# Patient Record
Sex: Male | Born: 1990 | Race: White | Hispanic: Yes | Marital: Married | Smoking: Never smoker
Health system: Southern US, Community
[De-identification: ages and names within clinical notes are randomized; demographics above are authoritative.]

---

## 2017-03-13 ENCOUNTER — Emergency Department (HOSPITAL_COMMUNITY): Payer: Worker's Compensation

## 2017-03-13 ENCOUNTER — Emergency Department (HOSPITAL_COMMUNITY)
Admission: EM | Admit: 2017-03-13 | Discharge: 2017-03-13 | Disposition: A | Discharge status: Home / Self Care | Payer: Worker's Compensation | Attending: Emergency Medicine | Admitting: Emergency Medicine

## 2017-03-13 ENCOUNTER — Encounter (HOSPITAL_COMMUNITY): Payer: Self-pay | Admitting: Emergency Medicine

## 2017-03-13 DIAGNOSIS — S61213A Laceration without foreign body of left middle finger without damage to nail, initial encounter: Secondary | ICD-10-CM

## 2017-03-13 DIAGNOSIS — Y9273 Farm field as the place of occurrence of the external cause: Secondary | ICD-10-CM | POA: Diagnosis not present

## 2017-03-13 DIAGNOSIS — Y990 Civilian activity done for income or pay: Secondary | ICD-10-CM | POA: Insufficient documentation

## 2017-03-13 DIAGNOSIS — W230XXA Caught, crushed, jammed, or pinched between moving objects, initial encounter: Secondary | ICD-10-CM | POA: Diagnosis not present

## 2017-03-13 DIAGNOSIS — Y9389 Activity, other specified: Secondary | ICD-10-CM | POA: Insufficient documentation

## 2017-03-13 MED ORDER — BUPIVACAINE HCL (PF) 0.5 % IJ SOLN: 10.0000 mL | Freq: Once | INTRAMUSCULAR | Status: DC

## 2017-03-13 MED ORDER — LIDOCAINE HCL (PF) 2 % IJ SOLN
10.0000 mL | Freq: Once | INTRAMUSCULAR | Status: AC
  Administered 2017-03-13: 10 mL
  Filled 2017-03-13: qty 10

## 2017-03-13 MED ORDER — TETANUS-DIPHTH-ACELL PERTUSSIS 5-2.5-18.5 LF-MCG/0.5 IM SUSP
0.5000 mL | Freq: Once | INTRAMUSCULAR | Status: AC
  Administered 2017-03-13: 0.5 mL via INTRAMUSCULAR
  Filled 2017-03-13: qty 0.5

## 2017-03-13 MED ORDER — CEPHALEXIN 500 MG PO CAPS: 500.0000 mg | ORAL_CAPSULE | Freq: Four times a day (QID) | ORAL | 0 refills | Status: AC

## 2017-03-13 MED ORDER — POVIDONE-IODINE 10 % EX SOLN
CUTANEOUS | Status: AC
  Filled 2017-03-13: qty 118

## 2017-03-13 MED ORDER — TRAMADOL HCL 50 MG PO TABS: 50.0000 mg | ORAL_TABLET | Freq: Four times a day (QID) | ORAL | 0 refills | Status: AC | PRN

## 2017-03-13 NOTE — ED Provider Notes (Signed)
[facility]-EM[facility]ENCY DEPT Provider Note   CSN: [id number] Arrival date & time: 03/13/17  0740     History   Chief Complaint Chief Complaint  Patient presents with  . Laceration    HPI Keith Mcfarland is a 26 y.o. 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  He denies numbness distal to the injury site. He has applied pressure to the site with hemostasis obtained.  He is unsure of his tetanus status.   The history is provided by the patient.    History reviewed. No pertinent past medical history.  There are no active problems to display for this patient.   History reviewed. No pertinent surgical history.     Home Medications    Prior to Admission medications   Medication Sig Start Date End Date Taking? Authorizing Provider  cephALEXin (KEFLEX) 500 MG capsule Take 1 capsule (500 mg total) by mouth 4 (four) times daily. 03/13/17   Burgess Amor, PA-C  traMADol (ULTRAM) 50 MG tablet Take 1 tablet (50 mg total) by mouth every 6 (six) hours as needed for moderate pain. 03/13/17   Burgess Amor, PA-C    Family History History reviewed. No pertinent family history.  Social History Social History  Substance Use Topics  . Smoking status: Never Smoker  . Smokeless tobacco: Never Used  . Alcohol use No     Allergies   Patient has no known allergies.   Review of Systems Review of Systems  Constitutional: Negative for fever.  Musculoskeletal: Positive for arthralgias. Negative for joint swelling and myalgias.  Skin: Positive for wound.  Neurological: Negative for weakness and numbness.     Physical Exam Updated Vital Signs BP 118/84 (BP Location: Right Arm)   Pulse (!) 52   Temp 99.2 F (37.3 C) (Oral)   Resp 17   SpO2 100%   Physical Exam  Constitutional: He is oriented to person, place, and time. He appears well-developed and well-nourished.    HENT:  Head: Normocephalic.  Cardiovascular: Normal rate.   Pulmonary/Chest: Effort normal.  Musculoskeletal: He exhibits tenderness.       Left hand: He exhibits bony tenderness and laceration. He exhibits no deformity. Normal sensation noted. He exhibits no thumb/finger opposition and no wrist extension trouble.  Linear abrasions noted volar and dorsal ring finger, hemostatic.  1 cm avulsion into the subcutaneous pip volar space of the long finger.  No deeper structures visualized. Distal sensation intact.   Neurological: He is alert and oriented to person, place, and time. No sensory deficit.  Skin: Laceration noted.     ED Treatments / Results  Labs (all labs ordered are listed, but only abnormal results are displayed) Labs Reviewed - No data to display  EKG  EKG Interpretation None       Radiology Dg Finger Middle Left  Result Date: 03/13/2017 CLINICAL DATA:  Laceration left middle digit. EXAM: LEFT MIDDLE FINGER 2+V COMPARISON:  No prior . FINDINGS: No acute bony or joint abnormality identified. No evidence of fracture or dislocation. Soft tissue laceration noted of the midportion of the left third digit. No radiopaque foreign body. IMPRESSION: No acute bony or joint abnormality identified. Soft tissue laceration noted over the midportion of the left third digit. No radiopaque foreign body . Electronically Signed   By: Maisie Fus  Register   On: 03/13/2017 08:19    Procedures Procedures (including critical care time)  LACERATION REPAIR Performed by: Burgess AmorIDOL, Ruger Saxer Authorized by: Burgess AmorIDOL, Litisha Guagliardo Consent: Verbal consent obtained. Risks and benefits: risks, benefits and alternatives were discussed Consent given by: patient Patient identity confirmed: provided demographic data Prepped and Draped in normal sterile fashion Wound explored  Laceration Location: left long finger  Laceration Length: 1cm  No Foreign Bodies seen or palpated  Anesthesia: digital block  Local  anesthetic: lidocaine 2% without epinephrine  Anesthetic total: 1 ml  Irrigation method: syringe Amount of cleaning: standard  Skin closure: ethilon 4-0  Number of sutures: #2 sutures applied to the ends of the wound for closer aproximation,  Central wound avulsed and not amenable to close approximation.  Xeroform applied, expect site will heal by secondary intent well.   Technique: simple interupted  Patient tolerance: Patient tolerated the procedure well with no immediate complications.   Medications Ordered in ED Medications  povidone-iodine (BETADINE) 10 % external solution (not administered)  lidocaine (XYLOCAINE) 2 % injection 10 mL (10 mLs Other Given by Other 03/13/17 0810)  Tdap (BOOSTRIX) injection 0.5 mL (0.5 mLs Intramuscular Given 03/13/17 0813)     Initial Impression / Assessment and Plan / ED Course  I have reviewed the triage vital signs and the nursing notes.  Pertinent labs & imaging results that were available during my care of the patient were reviewed by me and considered in my medical decision making (see chart for details).     Imaging reviewed, no fracture, pt displays FROM of digits, although painful, no deeper structures visualized, doubt ligament injury.  Dressings, xeroform, wound instructions, xeroform given.  He was updated with tetanus. Suture removal in 10 days.  Referral to Dr Romeo AppleHarrison for f/u care and/or return here for any worsened sx.    Final Clinical Impressions(s) / ED Diagnoses   Final diagnoses:  Laceration of left middle finger without foreign body without damage to nail, initial encounter    New Prescriptions New Prescriptions   CEPHALEXIN (KEFLEX) 500 MG CAPSULE    Take 1 capsule (500 mg total) by mouth 4 (four) times daily.   TRAMADOL (ULTRAM) 50 MG TABLET    Take 1 tablet (50 mg total) by mouth every 6 (six) hours as needed for moderate pain.     Burgess Amordol, Vearl Allbaugh, PA-C 03/13/17 0959    Samuel JesterMcManus, Kathleen, DO 03/17/17 Rickey [id number]

## 2017-03-13 NOTE — ED Notes (Signed)
Left hand placed in ns and betadine soak.

## 2017-03-13 NOTE — Discharge Instructions (Signed)
Keep your wounds clean and dry and covered.  Reapply a new layer of xeroform gauze (given) every day then wrap your fingers.  You should have the stitches taken out in 10 days.  Once a scab has formed on every area, you may start using a soap and water wash, but make sure the sites are dried completely before reapplying you dressing (no xeroform needed once you have scabs on these sites). Take you entire course of the antibiotic.  The pain medicine may make you a little sleepy - use caution with this medicine.

## 2017-03-13 NOTE — ED Triage Notes (Signed)
Pt here with boss. Cut his left middle finger on machine. Bleeding controlled. Gapping area at medial anterior knuckle wit hswelling  Small cut noted to posterior ring finer also.

## 2017-03-13 NOTE — ED Notes (Signed)
Kling applied to left hand.

## 2018-12-16 IMAGING — DX DG FINGER MIDDLE 2+V*L*
3 series · 3 of 3 positions shown · non-contrast
Comparison: No prior .

CLINICAL DATA: Laceration left middle digit.

EXAM:
LEFT MIDDLE FINGER 2+V

[finger ap]
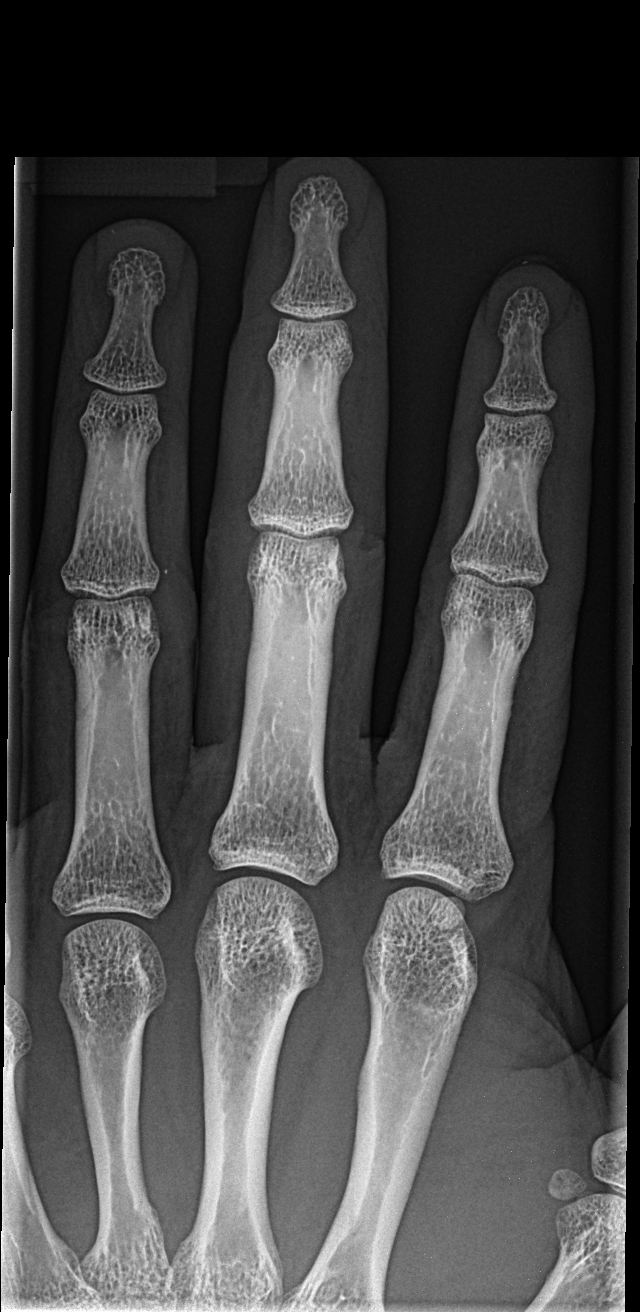

[finger obl]
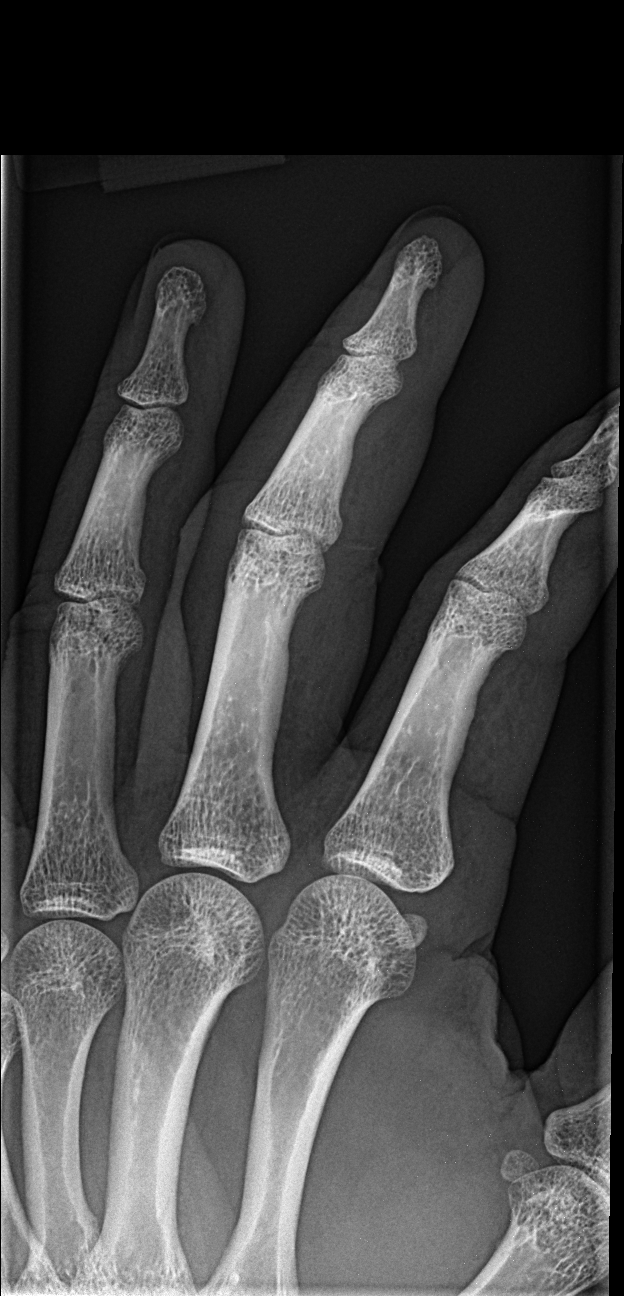

[finger lat]
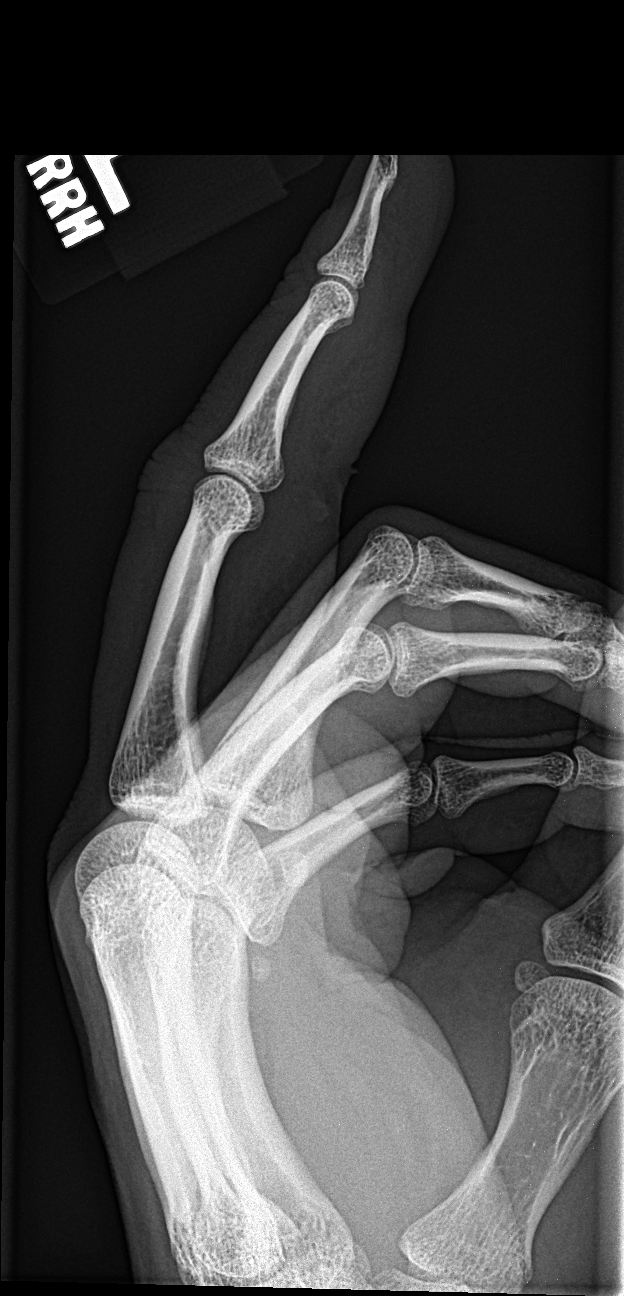

[3 of 3 positions shown; findings below may reference images not displayed]

FINDINGS: No acute bony or joint abnormality identified. No evidence of
fracture or dislocation. Soft tissue laceration noted of the
midportion of the left third digit. No radiopaque foreign body.
IMPRESSION: No acute bony or joint abnormality identified. Soft tissue
laceration noted over the midportion of the left third digit. No
radiopaque foreign body .
